# Patient Record
Sex: Female | Born: 1950 | Race: White | Hispanic: No | Marital: Married
Health system: Southern US, Community
[De-identification: ages and names within clinical notes are randomized; demographics above are authoritative.]

---

## 2009-10-27 ENCOUNTER — Encounter: Admission: RE | Admit: 2009-10-27 | Discharge: 2009-10-27 | Payer: Self-pay | Admitting: Family Medicine

## 2011-01-28 ENCOUNTER — Other Ambulatory Visit: Payer: Self-pay | Admitting: Family Medicine

## 2011-01-28 DIAGNOSIS — Z1231 Encounter for screening mammogram for malignant neoplasm of breast: Secondary | ICD-10-CM

## 2011-02-02 ENCOUNTER — Ambulatory Visit
Admission: RE | Admit: 2011-02-02 | Discharge: 2011-02-02 | Disposition: A | Discharge status: Home / Self Care | Payer: Self-pay | Source: Ambulatory Visit | Attending: Family Medicine | Admitting: Family Medicine

## 2011-02-02 DIAGNOSIS — Z1231 Encounter for screening mammogram for malignant neoplasm of breast: Secondary | ICD-10-CM

## 2012-06-23 ENCOUNTER — Other Ambulatory Visit: Payer: Self-pay | Admitting: Family Medicine

## 2012-06-23 DIAGNOSIS — Z1231 Encounter for screening mammogram for malignant neoplasm of breast: Secondary | ICD-10-CM

## 2012-07-04 ENCOUNTER — Ambulatory Visit: Payer: Managed Care, Other (non HMO)

## 2012-07-18 ENCOUNTER — Ambulatory Visit: Payer: Managed Care, Other (non HMO)

## 2013-06-12 ENCOUNTER — Other Ambulatory Visit: Payer: Self-pay | Admitting: Family Medicine

## 2013-06-12 DIAGNOSIS — Z1231 Encounter for screening mammogram for malignant neoplasm of breast: Secondary | ICD-10-CM

## 2013-06-12 DIAGNOSIS — M810 Age-related osteoporosis without current pathological fracture: Secondary | ICD-10-CM

## 2013-06-26 ENCOUNTER — Ambulatory Visit (INDEPENDENT_AMBULATORY_CARE_PROVIDER_SITE_OTHER): Payer: Managed Care, Other (non HMO)

## 2013-06-26 DIAGNOSIS — Z1231 Encounter for screening mammogram for malignant neoplasm of breast: Secondary | ICD-10-CM

## 2013-06-26 DIAGNOSIS — M810 Age-related osteoporosis without current pathological fracture: Secondary | ICD-10-CM

## 2015-05-29 ENCOUNTER — Other Ambulatory Visit: Payer: Self-pay | Admitting: Family Medicine

## 2015-05-29 DIAGNOSIS — Z1231 Encounter for screening mammogram for malignant neoplasm of breast: Secondary | ICD-10-CM

## 2015-08-21 ENCOUNTER — Ambulatory Visit (INDEPENDENT_AMBULATORY_CARE_PROVIDER_SITE_OTHER): Payer: Managed Care, Other (non HMO)

## 2015-08-21 DIAGNOSIS — Z1231 Encounter for screening mammogram for malignant neoplasm of breast: Secondary | ICD-10-CM

## 2015-10-15 ENCOUNTER — Other Ambulatory Visit: Payer: Self-pay | Admitting: Family Medicine

## 2015-10-15 DIAGNOSIS — M810 Age-related osteoporosis without current pathological fracture: Secondary | ICD-10-CM

## 2016-01-21 ENCOUNTER — Other Ambulatory Visit: Payer: Managed Care, Other (non HMO)

## 2016-01-28 ENCOUNTER — Ambulatory Visit (INDEPENDENT_AMBULATORY_CARE_PROVIDER_SITE_OTHER): Payer: Managed Care, Other (non HMO)

## 2016-01-28 DIAGNOSIS — M810 Age-related osteoporosis without current pathological fracture: Secondary | ICD-10-CM

## 2016-01-28 DIAGNOSIS — M85852 Other specified disorders of bone density and structure, left thigh: Secondary | ICD-10-CM

## 2017-03-31 ENCOUNTER — Other Ambulatory Visit: Payer: Self-pay | Admitting: Family Medicine

## 2017-03-31 DIAGNOSIS — Z1231 Encounter for screening mammogram for malignant neoplasm of breast: Secondary | ICD-10-CM

## 2017-04-13 ENCOUNTER — Ambulatory Visit: Payer: Managed Care, Other (non HMO)

## 2017-05-04 ENCOUNTER — Ambulatory Visit: Payer: Managed Care, Other (non HMO)

## 2017-05-06 ENCOUNTER — Ambulatory Visit (INDEPENDENT_AMBULATORY_CARE_PROVIDER_SITE_OTHER): Payer: Medicare Other

## 2017-05-06 DIAGNOSIS — Z1231 Encounter for screening mammogram for malignant neoplasm of breast: Secondary | ICD-10-CM

## 2018-07-13 ENCOUNTER — Other Ambulatory Visit: Payer: Self-pay | Admitting: Family Medicine

## 2018-07-13 DIAGNOSIS — Z1231 Encounter for screening mammogram for malignant neoplasm of breast: Secondary | ICD-10-CM

## 2018-08-22 ENCOUNTER — Ambulatory Visit
Admission: RE | Admit: 2018-08-22 | Discharge: 2018-08-22 | Disposition: A | Discharge status: Home / Self Care | Payer: Medicare Other | Source: Ambulatory Visit | Attending: Family Medicine | Admitting: Family Medicine

## 2018-08-22 DIAGNOSIS — Z1231 Encounter for screening mammogram for malignant neoplasm of breast: Secondary | ICD-10-CM

## 2018-10-17 ENCOUNTER — Other Ambulatory Visit: Payer: Self-pay | Admitting: Family Medicine

## 2018-10-17 DIAGNOSIS — Z780 Asymptomatic menopausal state: Secondary | ICD-10-CM

## 2018-10-17 DIAGNOSIS — M810 Age-related osteoporosis without current pathological fracture: Secondary | ICD-10-CM

## 2019-05-25 ENCOUNTER — Other Ambulatory Visit: Payer: Self-pay | Admitting: Family Medicine

## 2019-05-25 DIAGNOSIS — Z1231 Encounter for screening mammogram for malignant neoplasm of breast: Secondary | ICD-10-CM

## 2019-08-28 ENCOUNTER — Ambulatory Visit
Admission: RE | Admit: 2019-08-28 | Discharge: 2019-08-28 | Disposition: A | Discharge status: Home / Self Care | Payer: Medicare Other | Source: Ambulatory Visit | Attending: Family Medicine | Admitting: Family Medicine

## 2019-08-28 ENCOUNTER — Other Ambulatory Visit: Payer: Self-pay

## 2019-08-28 DIAGNOSIS — Z1231 Encounter for screening mammogram for malignant neoplasm of breast: Secondary | ICD-10-CM

## 2019-08-28 DIAGNOSIS — Z780 Asymptomatic menopausal state: Secondary | ICD-10-CM

## 2019-08-28 DIAGNOSIS — M810 Age-related osteoporosis without current pathological fracture: Secondary | ICD-10-CM

## 2019-10-25 ENCOUNTER — Other Ambulatory Visit: Payer: Medicare Other

## 2020-06-06 LAB — COLOGUARD: COLOGUARD: POSITIVE — AB

## 2020-08-21 ENCOUNTER — Other Ambulatory Visit: Payer: Self-pay | Admitting: Family Medicine

## 2020-08-21 ENCOUNTER — Other Ambulatory Visit: Payer: Self-pay | Admitting: Diagnostic Radiology

## 2020-08-21 DIAGNOSIS — Z1231 Encounter for screening mammogram for malignant neoplasm of breast: Secondary | ICD-10-CM

## 2020-10-01 DIAGNOSIS — M810 Age-related osteoporosis without current pathological fracture: Secondary | ICD-10-CM | POA: Diagnosis not present

## 2020-10-01 DIAGNOSIS — Z0000 Encounter for general adult medical examination without abnormal findings: Secondary | ICD-10-CM | POA: Diagnosis not present

## 2020-10-01 DIAGNOSIS — Z136 Encounter for screening for cardiovascular disorders: Secondary | ICD-10-CM | POA: Diagnosis not present

## 2020-10-08 DIAGNOSIS — R300 Dysuria: Secondary | ICD-10-CM | POA: Diagnosis not present

## 2020-10-09 ENCOUNTER — Other Ambulatory Visit: Payer: Self-pay

## 2020-10-09 ENCOUNTER — Ambulatory Visit
Admission: RE | Admit: 2020-10-09 | Discharge: 2020-10-09 | Disposition: A | Discharge status: Home / Self Care | Payer: Medicare Other | Source: Ambulatory Visit | Attending: Family Medicine | Admitting: Family Medicine

## 2020-10-09 DIAGNOSIS — Z1231 Encounter for screening mammogram for malignant neoplasm of breast: Secondary | ICD-10-CM

## 2020-10-10 ENCOUNTER — Other Ambulatory Visit: Payer: Self-pay | Admitting: Family Medicine

## 2020-10-10 DIAGNOSIS — R928 Other abnormal and inconclusive findings on diagnostic imaging of breast: Secondary | ICD-10-CM

## 2020-10-22 ENCOUNTER — Ambulatory Visit
Admission: RE | Admit: 2020-10-22 | Discharge: 2020-10-22 | Disposition: A | Discharge status: Home / Self Care | Payer: Medicare Other | Source: Ambulatory Visit | Attending: Family Medicine | Admitting: Family Medicine

## 2020-10-22 ENCOUNTER — Other Ambulatory Visit: Payer: Self-pay

## 2020-10-22 ENCOUNTER — Ambulatory Visit: Payer: Medicare Other

## 2020-10-22 DIAGNOSIS — R922 Inconclusive mammogram: Secondary | ICD-10-CM | POA: Diagnosis not present

## 2020-10-22 DIAGNOSIS — R928 Other abnormal and inconclusive findings on diagnostic imaging of breast: Secondary | ICD-10-CM

## 2020-12-16 DIAGNOSIS — L821 Other seborrheic keratosis: Secondary | ICD-10-CM | POA: Diagnosis not present

## 2020-12-16 DIAGNOSIS — L820 Inflamed seborrheic keratosis: Secondary | ICD-10-CM | POA: Diagnosis not present

## 2020-12-16 DIAGNOSIS — L578 Other skin changes due to chronic exposure to nonionizing radiation: Secondary | ICD-10-CM | POA: Diagnosis not present

## 2021-06-11 DIAGNOSIS — M25552 Pain in left hip: Secondary | ICD-10-CM | POA: Diagnosis not present

## 2021-06-15 DIAGNOSIS — Z23 Encounter for immunization: Secondary | ICD-10-CM | POA: Diagnosis not present

## 2021-06-29 DIAGNOSIS — M25552 Pain in left hip: Secondary | ICD-10-CM | POA: Diagnosis not present

## 2021-08-10 ENCOUNTER — Encounter: Payer: Self-pay | Admitting: Podiatry

## 2021-08-10 ENCOUNTER — Ambulatory Visit: Payer: Medicare Other | Admitting: Podiatry

## 2021-08-10 ENCOUNTER — Ambulatory Visit (INDEPENDENT_AMBULATORY_CARE_PROVIDER_SITE_OTHER): Payer: Medicare Other

## 2021-08-10 ENCOUNTER — Other Ambulatory Visit: Payer: Self-pay

## 2021-08-10 DIAGNOSIS — M79671 Pain in right foot: Secondary | ICD-10-CM

## 2021-08-10 DIAGNOSIS — M779 Enthesopathy, unspecified: Secondary | ICD-10-CM

## 2021-08-10 DIAGNOSIS — M7671 Peroneal tendinitis, right leg: Secondary | ICD-10-CM

## 2021-08-10 MED ORDER — TRIAMCINOLONE ACETONIDE 10 MG/ML IJ SUSP
10.0000 mg | Freq: Once | INTRAMUSCULAR | Status: AC
  Administered 2021-08-10: 10 mg

## 2021-08-10 NOTE — Progress Notes (Signed)
Subjective:   Patient ID: Cynthia Strong, female   DOB: 70 y.o.   MRN: [id number]   HPI Patient presents stating she is having a lot of pain in the outside of her right foot and also had hip problems left and has a bad big toe joint of the right foot.  States that its been getting sore on this outside and has been present for a number of weeks worse over the last 2 weeks.  Patient does not smoke likes to be active   Review of Systems  All other systems reviewed and are negative.      Objective:  Physical Exam Vitals and nursing note reviewed.  Constitutional:      Appearance: She is well-developed.  Pulmonary:     Effort: Pulmonary effort is normal.  Musculoskeletal:        General: Normal range of motion.  Skin:    General: Skin is warm.  Neurological:     Mental Status: She is alert.    Neurovascular status intact muscle strength found to be adequate range of motion within normal limits.  Patient is found to have inflammation and pain of the outside of the right foot with fluid buildup at the base of the fifth metatarsal where the tendon connects to the bone.  Patient has severe loss of motion first MPJ right minimal discomfort and has had hip pain left.  Good digital perfusion well oriented x3     Assessment:  Acute peroneal tendinitis right with inflammation with hallux limitus rigidus deformity right     Plan:  H&P reviewed condition and I get a focus on the right foot and I did explain injection chances for rupture and she wants procedure.  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

## 2021-08-10 NOTE — Progress Notes (Signed)
Dg righ

## 2021-08-11 ENCOUNTER — Other Ambulatory Visit: Payer: Self-pay | Admitting: Podiatry

## 2021-08-11 DIAGNOSIS — M779 Enthesopathy, unspecified: Secondary | ICD-10-CM

## 2021-08-20 ENCOUNTER — Other Ambulatory Visit: Payer: Self-pay

## 2021-08-20 ENCOUNTER — Encounter: Payer: Self-pay | Admitting: Podiatry

## 2021-08-20 ENCOUNTER — Ambulatory Visit: Payer: Medicare Other | Admitting: Podiatry

## 2021-08-20 DIAGNOSIS — M7671 Peroneal tendinitis, right leg: Secondary | ICD-10-CM | POA: Diagnosis not present

## 2021-08-20 MED ORDER — MELOXICAM 15 MG PO TABS: 15.0000 mg | ORAL_TABLET | Freq: Every day | ORAL | 0 refills | Status: AC

## 2021-08-20 NOTE — Patient Instructions (Signed)
Peroneal Tendinopathy Rehab ?Ask your health care provider which exercises are safe for you. Do exercises exactly as told by your health care provider and adjust them as directed. It is normal to feel mild stretching, pulling, tightness, or discomfort as you do these exercises. Stop right away if you feel sudden pain or your pain gets worse. Do not begin these exercises until told by your health care provider. ?Stretching and range-of-motion exercises ?These exercises warm up your muscles and joints and improve the movement and flexibility of your ankle. These exercises also help to relieve pain and stiffness. ?Gastroc and soleus stretch, standing ?This is an exercise in which you stand on a step and use your body weight to stretch your calf muscles. To do this exercise: ?Stand on the edge of a step on the ball of your left / right foot. The ball of your foot is on the walking surface, right under your toes. ?Keep your other foot firmly on the same step. ?Hold on to the wall, a railing, or a chair for balance. ?Slowly lift your other foot, allowing your body weight to press your left / right heel down over the edge of the step. You should feel a stretch in your left / right calf (gastrocnemius and soleus). ?Hold this position for __________ seconds. ?Return both feet to the step. ?Repeat this exercise with a slight bend in your left / right knee. ?Repeat __________ times with your left / right knee straight and __________ times with your left / right knee bent. Complete this exercise __________ times a day. ?Strengthening exercises ?These exercises build strength and endurance in your foot and ankle. Endurance is the ability to use your muscles for a long time, even after they get tired. ?Ankle dorsiflexion with band ? ?Secure a rubber exercise band or tube to an object, such as a table leg, that will not move when the band is pulled. ?Secure the other end of the band around your left / right foot. ?Sit on the  floor, facing the object with your left / right leg extended. The band or tube should be slightly tense when your foot is relaxed. ?Slowly flex your left / right ankle and toes to bring your foot toward you (dorsiflexion). ?Hold this position for __________ seconds. ?Let the band or tube slowly pull your foot back to the starting position. ?Repeat __________ times. Complete this exercise __________ times a day. ?Ankle eversion ?Sit on the floor with your legs straight out in front of you. ?Loop a rubber exercise band or tube around the ball of your left / right foot. The ball of your foot is on the walking surface, right under your toes. ?Hold the ends of the band in your hands, or secure the band to a stable object. The band or tube should be slightly tense when your foot is relaxed. ?Slowly push your foot outward, away from your other leg (eversion). ?Hold this position for __________ seconds. ?Slowly return your foot to the starting position. ?Repeat __________ times. Complete this exercise __________ times a day. ?Plantar flexion, standing ?This exercise is sometimes called standing heel raise. ?Stand with your feet shoulder-width apart. ?Place your hands on a wall or table to steady yourself as needed, but try not to use it for support. ?Keep your weight spread evenly over the width of your feet while you slowly rise up on your toes (plantar flexion). If told by your health care provider: ?Shift your weight toward your left / right   leg until you feel challenged. ?Stand on your left / right leg only. ?Hold this position for __________ seconds. ?Repeat __________ times. Complete this exercise __________ times a day. ?Single leg stand ?Without shoes, stand near a railing or in a doorway. You may hold on to the railing or door frame as needed. ?Stand on your left / right foot. Keep your big toe down on the floor and try to keep your arch lifted. ?Do not roll to the outside of your foot. ?If this exercise is too  easy, you can try it with your eyes closed or while standing on a pillow. ?Hold this position for __________ seconds. ?Repeat __________ times. Complete this exercise __________ times a day. ?This information is not intended to replace advice given to you by your health care provider. Make sure you discuss any questions you have with your health care provider. ?Document Revised: 12/26/2018 Document Reviewed: 12/26/2018 ?[employer]vier Patient Education ? 2022 [employer] ? ?

## 2021-08-20 NOTE — Progress Notes (Signed)
  Subjective:  Patient ID: Cynthia Strong, female    DOB: 05/18/1951,   MRN: [id number]  Chief Complaint  Patient presents with   Follow-up    Acute peroneal tendinitis right with inflammation with hallux limitus rigidus deformity right      70 y.o. female presents for concern of right lateral foot pain She saw Dr. Charlsie Merles a few weeks ago and received an injection that did not make much of a difference. Relates she is concerned she does not have enough support . Denies any other pedal complaints. Denies n/v/f/c.   No past medical history on file.  Objective:  Physical Exam: Vascular: DP/PT pulses 2/4 bilateral. CFT <3 seconds. Normal hair growth on digits. No edema.  Skin. No lacerations or abrasions bilateral feet.  Musculoskeletal: MMT 5/5 bilateral lower extremities in DF, PF, Inversion and Eversion. Deceased ROM in DF of ankle joint. Tender to peroneal tendon insertion on fifth metatarsal. Tender with eversion and dorsiflexion Neurological: Sensation intact to light touch.   Assessment:   1. Peroneal tendinitis of right lower extremity      Plan:  Patient was evaluated and treated and all questions answered. X-rays reviewed and discussed with patient. Discussed peroneal tendinitis and treatment options at length with patient Discussed stretching exercises and provided handout. Referral to PT to add on peroneal tendon exercises and modalities.  Prescription for meloxicam provided Dispensed Tri-Lock ankle brace. Discussed that if the symptoms do not improve can consider PT/MRI. Patient to return in 6 to 8 weeks or sooner if symptoms fail to improve or worsen.   Louann Sjogren, DPM

## 2021-08-21 ENCOUNTER — Other Ambulatory Visit: Payer: Self-pay | Admitting: Podiatry

## 2021-08-21 DIAGNOSIS — M7671 Peroneal tendinitis, right leg: Secondary | ICD-10-CM

## 2021-08-25 DIAGNOSIS — M6259 Muscle wasting and atrophy, not elsewhere classified, multiple sites: Secondary | ICD-10-CM | POA: Diagnosis not present

## 2021-08-25 DIAGNOSIS — M25552 Pain in left hip: Secondary | ICD-10-CM | POA: Diagnosis not present

## 2021-08-25 DIAGNOSIS — R269 Unspecified abnormalities of gait and mobility: Secondary | ICD-10-CM | POA: Diagnosis not present

## 2021-08-31 DIAGNOSIS — M6259 Muscle wasting and atrophy, not elsewhere classified, multiple sites: Secondary | ICD-10-CM | POA: Diagnosis not present

## 2021-08-31 DIAGNOSIS — M25552 Pain in left hip: Secondary | ICD-10-CM | POA: Diagnosis not present

## 2021-08-31 DIAGNOSIS — R269 Unspecified abnormalities of gait and mobility: Secondary | ICD-10-CM | POA: Diagnosis not present

## 2021-09-17 ENCOUNTER — Other Ambulatory Visit: Payer: Self-pay

## 2021-09-17 ENCOUNTER — Ambulatory Visit: Payer: Medicare Other | Admitting: Podiatry

## 2021-09-17 ENCOUNTER — Encounter: Payer: Self-pay | Admitting: Podiatry

## 2021-09-17 DIAGNOSIS — M7671 Peroneal tendinitis, right leg: Secondary | ICD-10-CM | POA: Diagnosis not present

## 2021-09-17 MED ORDER — DEXAMETHASONE SODIUM PHOSPHATE 120 MG/30ML IJ SOLN: 4.0000 mg | Freq: Once | INTRAMUSCULAR | Status: AC

## 2021-09-17 NOTE — Progress Notes (Signed)
°  Subjective:  Patient ID: Cynthia Strong, female    DOB: 08-20-1951,   MRN: [id number]  Chief Complaint  Patient presents with   Foot Pain    Right foot pain follow up , patient states pain has not gotten any better since the last visit. Pain has shifted     70 y.o. female presents for follow-up of right peroneal tendonitis. Patient relates she was doing well and then tweaked her foot on a pine cone and has been having more pain in the area on the outside of her foot. Has been doing PT exercises and taking meloxicam which she is not sure helps. Not sure the brace has been helping. Denies any other pedal complaints. Denies n/v/f/c.   History reviewed. No pertinent past medical history.  Objective:  Physical Exam: Vascular: DP/PT pulses 2/4 bilateral. CFT <3 seconds. Normal hair growth on digits. No edema.  Skin. No lacerations or abrasions bilateral feet.  Musculoskeletal: MMT 5/5 bilateral lower extremities in DF, PF, Inversion and Eversion. Deceased ROM in DF of ankle joint. Tender to peroneal tendon insertion on fifth metatarsal and more proximally along the tendon to malleolus. . Tender with eversion and dorsiflexion Neurological: Sensation intact to light touch.   Assessment:   1. Peroneal tendinitis of right lower extremity      Plan:  Patient was evaluated and treated and all questions answered. X-rays reviewed and discussed with patient. Discussed peroneal tendinitis and treatment options at length with patient Discussed stretching exercises and provided handout. Continue PT  Discussed trying topical voltaren Continue with  Tri-Lock ankle brace. Injection today. Procedure below  Discussed that if the symptoms do not improve can consider MRI. Patient to return as needed.    Procedure: Injection Tendon/Ligament Discussed alternatives, risks, complications and verbal consent was obtained.  Location: Right peroneal tendon sheath . Skin Prep: Alcohol. Injectate: 1cc  0.5% marcaine plain, 1 cc dexamethasone.  Disposition: Patient tolerated procedure well. Injection site dressed with a band-aid.  Post-injection care was discussed and return precautions discussed.    Louann Sjogren, DPM

## 2021-09-24 DIAGNOSIS — M25552 Pain in left hip: Secondary | ICD-10-CM | POA: Diagnosis not present

## 2021-09-24 DIAGNOSIS — R269 Unspecified abnormalities of gait and mobility: Secondary | ICD-10-CM | POA: Diagnosis not present

## 2021-09-24 DIAGNOSIS — M6259 Muscle wasting and atrophy, not elsewhere classified, multiple sites: Secondary | ICD-10-CM | POA: Diagnosis not present

## 2021-09-28 ENCOUNTER — Other Ambulatory Visit: Payer: Self-pay | Admitting: Family Medicine

## 2021-09-28 DIAGNOSIS — Z1231 Encounter for screening mammogram for malignant neoplasm of breast: Secondary | ICD-10-CM

## 2021-10-09 DIAGNOSIS — M25552 Pain in left hip: Secondary | ICD-10-CM | POA: Diagnosis not present

## 2021-10-09 DIAGNOSIS — M6259 Muscle wasting and atrophy, not elsewhere classified, multiple sites: Secondary | ICD-10-CM | POA: Diagnosis not present

## 2021-10-09 DIAGNOSIS — R269 Unspecified abnormalities of gait and mobility: Secondary | ICD-10-CM | POA: Diagnosis not present

## 2021-10-12 DIAGNOSIS — Z0000 Encounter for general adult medical examination without abnormal findings: Secondary | ICD-10-CM | POA: Diagnosis not present

## 2021-10-12 DIAGNOSIS — Z23 Encounter for immunization: Secondary | ICD-10-CM | POA: Diagnosis not present

## 2021-10-12 DIAGNOSIS — Z1231 Encounter for screening mammogram for malignant neoplasm of breast: Secondary | ICD-10-CM | POA: Diagnosis not present

## 2021-10-14 ENCOUNTER — Ambulatory Visit
Admission: RE | Admit: 2021-10-14 | Discharge: 2021-10-14 | Disposition: A | Discharge status: Home / Self Care | Payer: Medicare Other | Source: Ambulatory Visit | Attending: Family Medicine | Admitting: Family Medicine

## 2021-10-14 DIAGNOSIS — Z1231 Encounter for screening mammogram for malignant neoplasm of breast: Secondary | ICD-10-CM | POA: Diagnosis not present

## 2022-05-17 DIAGNOSIS — Z0100 Encounter for examination of eyes and vision without abnormal findings: Secondary | ICD-10-CM | POA: Diagnosis not present

## 2022-05-19 DIAGNOSIS — M7062 Trochanteric bursitis, left hip: Secondary | ICD-10-CM | POA: Diagnosis not present

## 2022-05-28 ENCOUNTER — Other Ambulatory Visit: Payer: Self-pay | Admitting: Family Medicine

## 2022-05-28 DIAGNOSIS — M25552 Pain in left hip: Secondary | ICD-10-CM

## 2022-06-01 ENCOUNTER — Ambulatory Visit (INDEPENDENT_AMBULATORY_CARE_PROVIDER_SITE_OTHER): Payer: Medicare Other

## 2022-06-01 DIAGNOSIS — G8929 Other chronic pain: Secondary | ICD-10-CM | POA: Diagnosis not present

## 2022-06-01 DIAGNOSIS — M25552 Pain in left hip: Secondary | ICD-10-CM | POA: Diagnosis not present

## 2022-06-09 DIAGNOSIS — Z23 Encounter for immunization: Secondary | ICD-10-CM | POA: Diagnosis not present

## 2022-06-23 DIAGNOSIS — R6889 Other general symptoms and signs: Secondary | ICD-10-CM | POA: Diagnosis not present

## 2022-10-07 ENCOUNTER — Other Ambulatory Visit: Payer: Self-pay | Admitting: Family Medicine

## 2022-10-07 DIAGNOSIS — Z1231 Encounter for screening mammogram for malignant neoplasm of breast: Secondary | ICD-10-CM

## 2022-10-19 DIAGNOSIS — M810 Age-related osteoporosis without current pathological fracture: Secondary | ICD-10-CM | POA: Diagnosis not present

## 2022-10-19 DIAGNOSIS — Z0000 Encounter for general adult medical examination without abnormal findings: Secondary | ICD-10-CM | POA: Diagnosis not present

## 2022-10-19 DIAGNOSIS — Z8669 Personal history of other diseases of the nervous system and sense organs: Secondary | ICD-10-CM | POA: Diagnosis not present

## 2022-10-19 DIAGNOSIS — K219 Gastro-esophageal reflux disease without esophagitis: Secondary | ICD-10-CM | POA: Diagnosis not present

## 2022-10-19 DIAGNOSIS — Z7185 Encounter for immunization safety counseling: Secondary | ICD-10-CM | POA: Diagnosis not present

## 2022-10-19 DIAGNOSIS — Z13220 Encounter for screening for lipoid disorders: Secondary | ICD-10-CM | POA: Diagnosis not present

## 2022-10-19 DIAGNOSIS — R030 Elevated blood-pressure reading, without diagnosis of hypertension: Secondary | ICD-10-CM | POA: Diagnosis not present

## 2022-10-19 DIAGNOSIS — L819 Disorder of pigmentation, unspecified: Secondary | ICD-10-CM | POA: Diagnosis not present

## 2022-10-19 DIAGNOSIS — M67449 Ganglion, unspecified hand: Secondary | ICD-10-CM | POA: Diagnosis not present

## 2022-10-19 DIAGNOSIS — Z136 Encounter for screening for cardiovascular disorders: Secondary | ICD-10-CM | POA: Diagnosis not present

## 2022-10-20 ENCOUNTER — Other Ambulatory Visit: Payer: Self-pay | Admitting: Family Medicine

## 2022-10-20 DIAGNOSIS — M810 Age-related osteoporosis without current pathological fracture: Secondary | ICD-10-CM

## 2022-11-02 DIAGNOSIS — M67441 Ganglion, right hand: Secondary | ICD-10-CM | POA: Diagnosis not present

## 2022-11-02 DIAGNOSIS — M1811 Unilateral primary osteoarthritis of first carpometacarpal joint, right hand: Secondary | ICD-10-CM | POA: Diagnosis not present

## 2022-11-02 DIAGNOSIS — M79644 Pain in right finger(s): Secondary | ICD-10-CM | POA: Diagnosis not present

## 2022-11-26 ENCOUNTER — Ambulatory Visit
Admission: RE | Admit: 2022-11-26 | Discharge: 2022-11-26 | Disposition: A | Discharge status: Home / Self Care | Payer: Medicare Other | Source: Ambulatory Visit | Attending: Family Medicine | Admitting: Family Medicine

## 2022-11-26 DIAGNOSIS — Z1231 Encounter for screening mammogram for malignant neoplasm of breast: Secondary | ICD-10-CM | POA: Diagnosis not present

## 2022-11-29 DIAGNOSIS — I10 Essential (primary) hypertension: Secondary | ICD-10-CM | POA: Diagnosis not present

## 2022-12-22 DIAGNOSIS — C4372 Malignant melanoma of left lower limb, including hip: Secondary | ICD-10-CM | POA: Diagnosis not present

## 2022-12-29 DIAGNOSIS — C4372 Malignant melanoma of left lower limb, including hip: Secondary | ICD-10-CM | POA: Diagnosis not present

## 2023-01-03 DIAGNOSIS — C4372 Malignant melanoma of left lower limb, including hip: Secondary | ICD-10-CM | POA: Diagnosis not present

## 2023-01-03 IMAGING — MG DIGITAL SCREENING BILAT W/ TOMO W/ CAD
6 of 10 series · 6 of 30 positions shown · non-contrast
Comparison: Previous exam(s).

CLINICAL DATA: Screening.

EXAM:
DIGITAL SCREENING BILATERAL MAMMOGRAM WITH TOMO AND CAD

[L CC synth-2D]
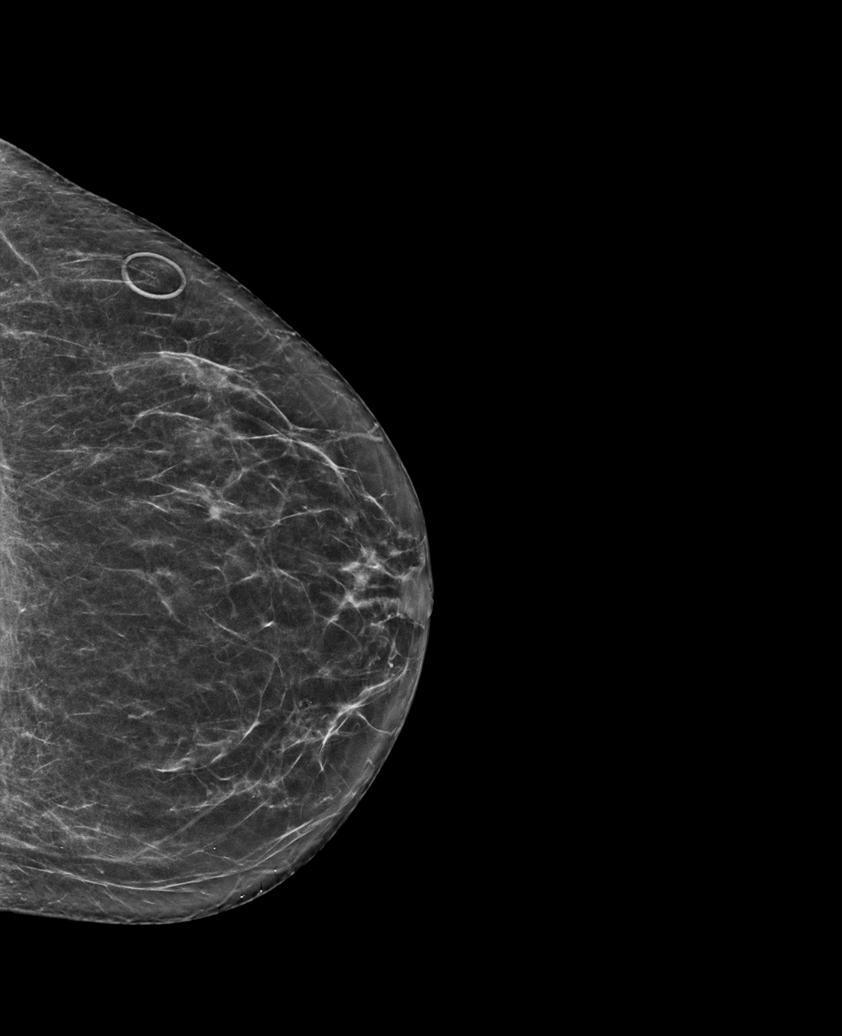

[R CC synth-2D]
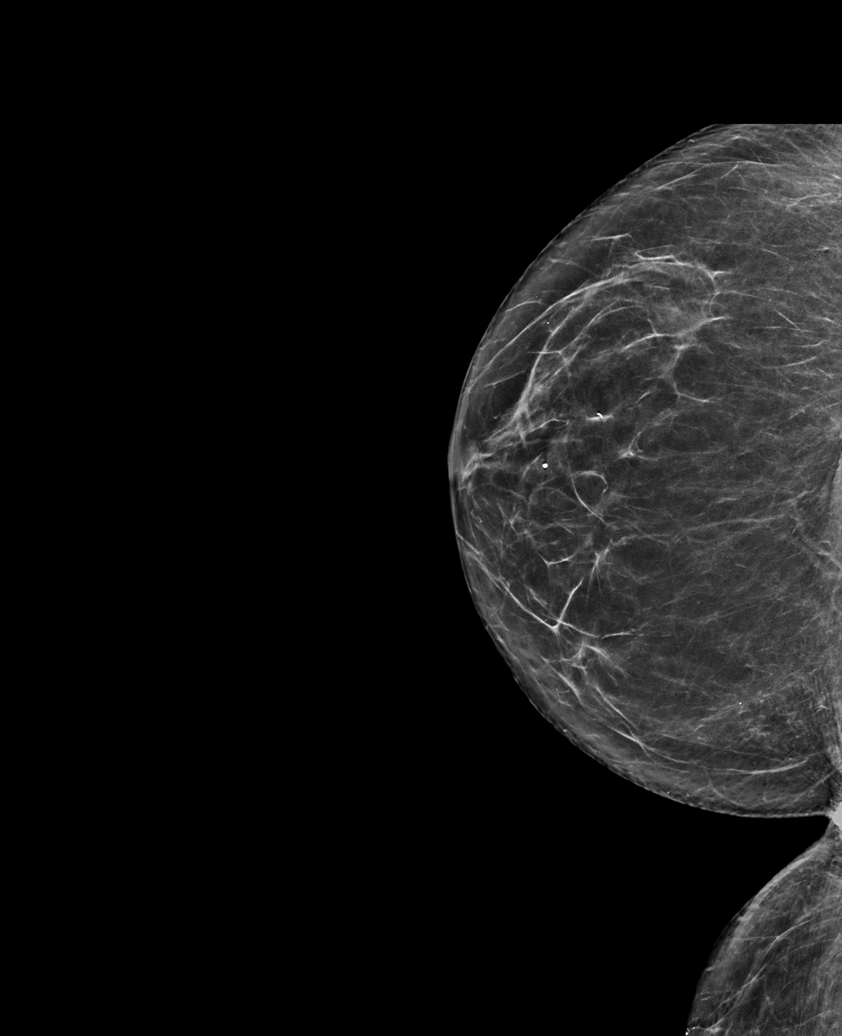

[L MLO synth-2D (1 of 2)]
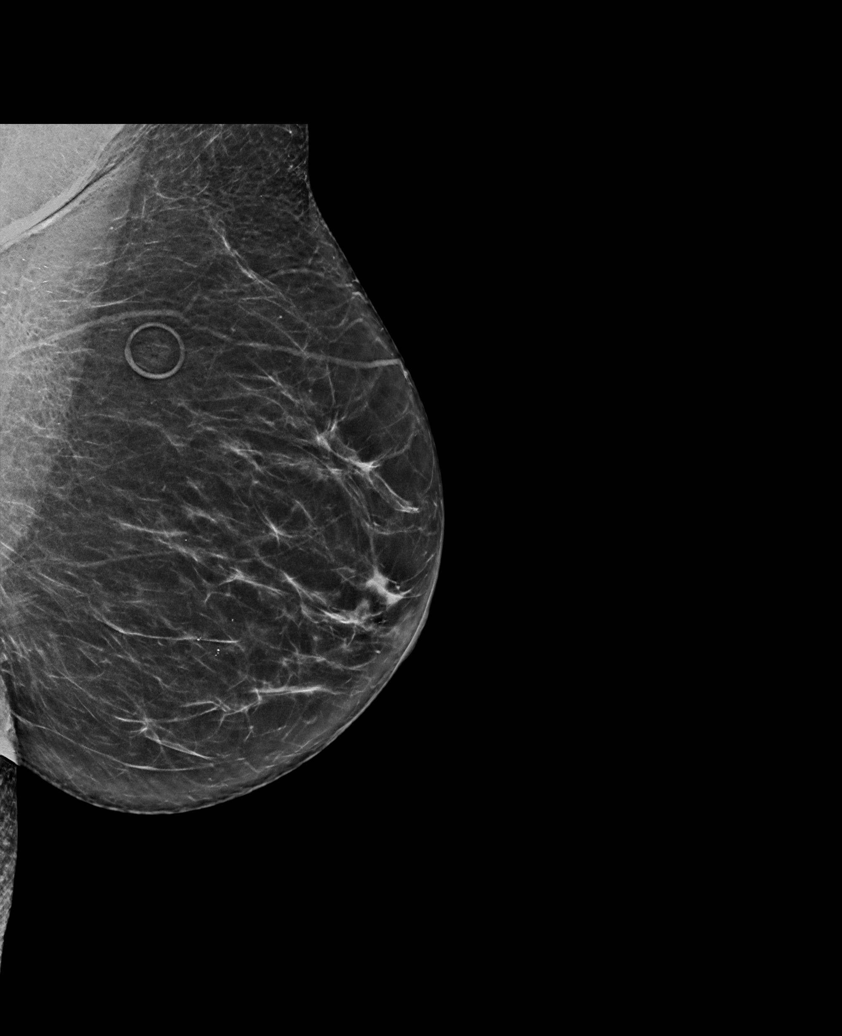

[R MLO synth-2D]
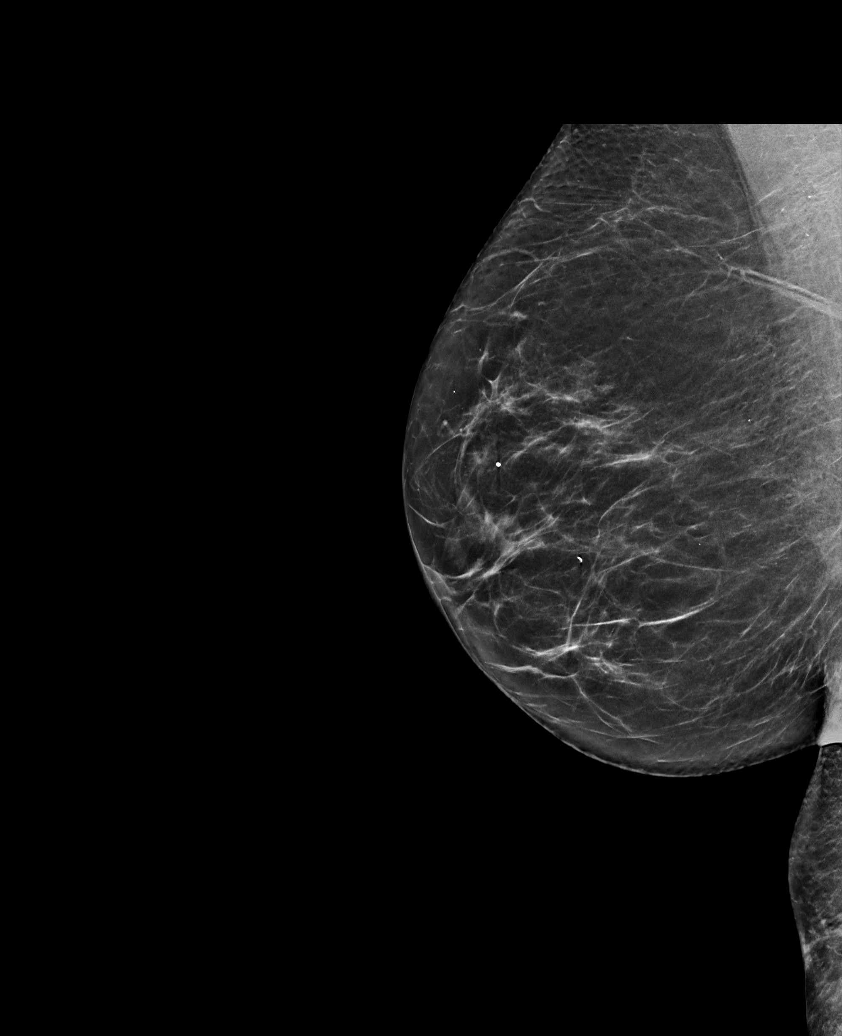

[L MLO synth-2D (2 of 2)]
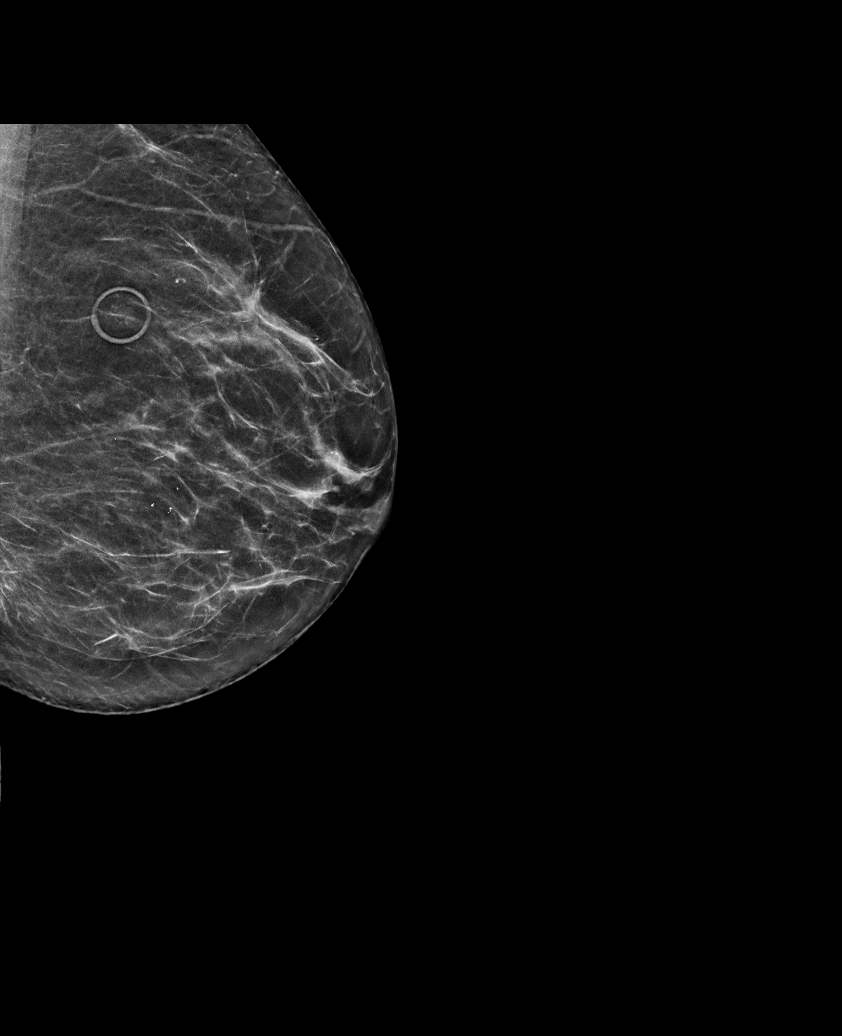

[L CC tomo · tomo slice 31/62.0]
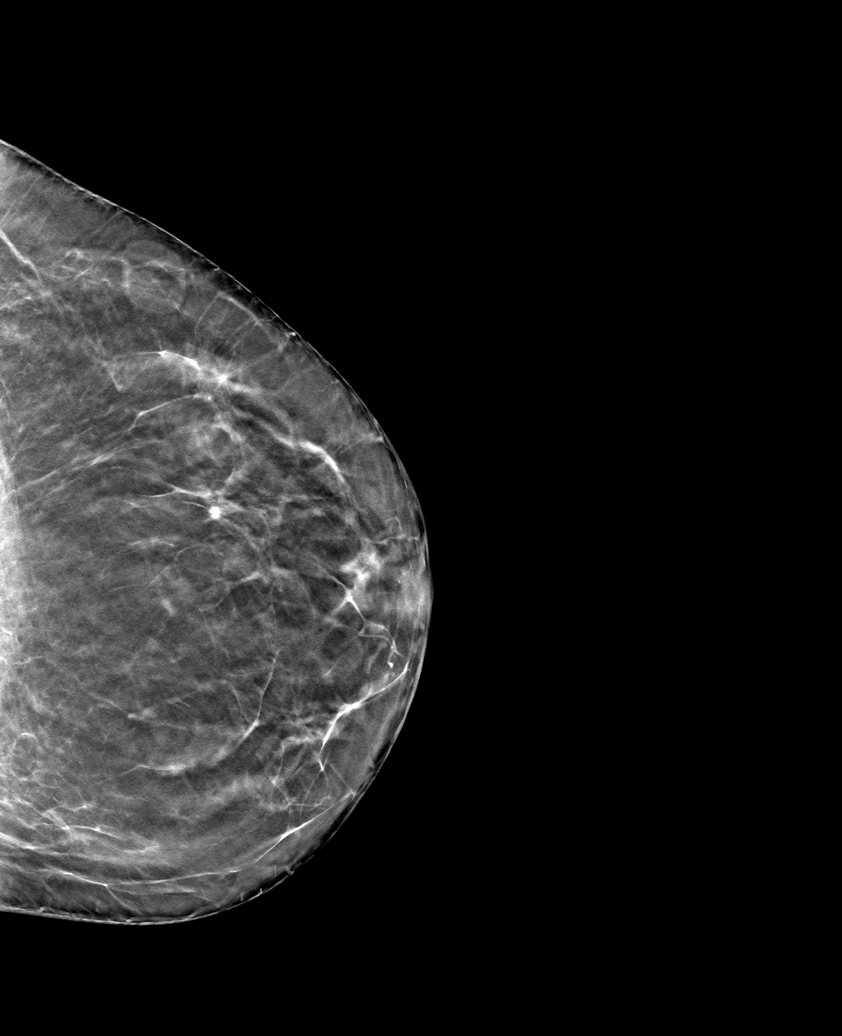

[6 of 30 positions shown; findings below may reference images not displayed]

ACR Breast Density Category b: There are scattered areas of
fibroglandular density.
FINDINGS: In the left breast, a possible asymmetry warrants further
evaluation. In the right breast, no findings suspicious for
malignancy. Images were processed with CAD.
IMPRESSION: Further evaluation is suggested for possible asymmetry in the left
breast.

RECOMMENDATION:
Diagnostic mammogram and possibly ultrasound of the left breast.
(Code:DI-5-LL4)

The patient will be contacted regarding the findings, and additional
imaging will be scheduled.

BI-RADS CATEGORY  0: Incomplete. Need additional imaging evaluation
and/or prior mammograms for comparison.

## 2023-01-18 DIAGNOSIS — C4372 Malignant melanoma of left lower limb, including hip: Secondary | ICD-10-CM | POA: Diagnosis not present

## 2023-01-18 DIAGNOSIS — I10 Essential (primary) hypertension: Secondary | ICD-10-CM | POA: Diagnosis not present

## 2023-02-01 DIAGNOSIS — C4372 Malignant melanoma of left lower limb, including hip: Secondary | ICD-10-CM | POA: Diagnosis not present

## 2023-02-01 DIAGNOSIS — L989 Disorder of the skin and subcutaneous tissue, unspecified: Secondary | ICD-10-CM | POA: Diagnosis not present

## 2023-02-01 DIAGNOSIS — L905 Scar conditions and fibrosis of skin: Secondary | ICD-10-CM | POA: Diagnosis not present

## 2023-03-22 DIAGNOSIS — R6889 Other general symptoms and signs: Secondary | ICD-10-CM | POA: Diagnosis not present

## 2023-04-07 DIAGNOSIS — D485 Neoplasm of uncertain behavior of skin: Secondary | ICD-10-CM | POA: Diagnosis not present

## 2023-04-07 DIAGNOSIS — Z85820 Personal history of malignant melanoma of skin: Secondary | ICD-10-CM | POA: Diagnosis not present

## 2023-04-07 DIAGNOSIS — R229 Localized swelling, mass and lump, unspecified: Secondary | ICD-10-CM | POA: Diagnosis not present

## 2023-04-07 DIAGNOSIS — D225 Melanocytic nevi of trunk: Secondary | ICD-10-CM | POA: Diagnosis not present

## 2023-04-07 DIAGNOSIS — C4372 Malignant melanoma of left lower limb, including hip: Secondary | ICD-10-CM | POA: Diagnosis not present

## 2023-04-07 DIAGNOSIS — D229 Melanocytic nevi, unspecified: Secondary | ICD-10-CM | POA: Diagnosis not present

## 2023-04-07 DIAGNOSIS — L821 Other seborrheic keratosis: Secondary | ICD-10-CM | POA: Diagnosis not present

## 2023-04-07 DIAGNOSIS — Z08 Encounter for follow-up examination after completed treatment for malignant neoplasm: Secondary | ICD-10-CM | POA: Diagnosis not present

## 2023-04-07 DIAGNOSIS — L814 Other melanin hyperpigmentation: Secondary | ICD-10-CM | POA: Diagnosis not present

## 2023-04-14 ENCOUNTER — Ambulatory Visit
Admission: RE | Admit: 2023-04-14 | Discharge: 2023-04-14 | Disposition: A | Discharge status: Home / Self Care | Payer: Medicare Other | Source: Ambulatory Visit | Attending: Family Medicine | Admitting: Family Medicine

## 2023-04-14 DIAGNOSIS — M8588 Other specified disorders of bone density and structure, other site: Secondary | ICD-10-CM | POA: Diagnosis not present

## 2023-04-14 DIAGNOSIS — M810 Age-related osteoporosis without current pathological fracture: Secondary | ICD-10-CM

## 2023-04-14 DIAGNOSIS — E349 Endocrine disorder, unspecified: Secondary | ICD-10-CM | POA: Diagnosis not present

## 2023-05-03 DIAGNOSIS — I10 Essential (primary) hypertension: Secondary | ICD-10-CM | POA: Diagnosis not present

## 2023-06-16 DIAGNOSIS — Z23 Encounter for immunization: Secondary | ICD-10-CM | POA: Diagnosis not present

## 2023-06-28 DIAGNOSIS — L720 Epidermal cyst: Secondary | ICD-10-CM | POA: Diagnosis not present

## 2023-06-28 DIAGNOSIS — D1801 Hemangioma of skin and subcutaneous tissue: Secondary | ICD-10-CM | POA: Diagnosis not present

## 2023-06-28 DIAGNOSIS — L820 Inflamed seborrheic keratosis: Secondary | ICD-10-CM | POA: Diagnosis not present

## 2023-06-28 DIAGNOSIS — D692 Other nonthrombocytopenic purpura: Secondary | ICD-10-CM | POA: Diagnosis not present

## 2023-07-29 DIAGNOSIS — D225 Melanocytic nevi of trunk: Secondary | ICD-10-CM | POA: Diagnosis not present

## 2023-07-29 DIAGNOSIS — L905 Scar conditions and fibrosis of skin: Secondary | ICD-10-CM | POA: Diagnosis not present

## 2023-09-23 ENCOUNTER — Other Ambulatory Visit: Payer: Self-pay | Admitting: Family Medicine

## 2023-09-23 DIAGNOSIS — Z1231 Encounter for screening mammogram for malignant neoplasm of breast: Secondary | ICD-10-CM

## 2023-10-05 DIAGNOSIS — R6889 Other general symptoms and signs: Secondary | ICD-10-CM | POA: Diagnosis not present

## 2023-10-21 DIAGNOSIS — Z136 Encounter for screening for cardiovascular disorders: Secondary | ICD-10-CM | POA: Diagnosis not present

## 2023-10-21 DIAGNOSIS — Z85820 Personal history of malignant melanoma of skin: Secondary | ICD-10-CM | POA: Diagnosis not present

## 2023-10-21 DIAGNOSIS — Z0000 Encounter for general adult medical examination without abnormal findings: Secondary | ICD-10-CM | POA: Diagnosis not present

## 2023-10-21 DIAGNOSIS — M7062 Trochanteric bursitis, left hip: Secondary | ICD-10-CM | POA: Diagnosis not present

## 2023-10-21 DIAGNOSIS — D126 Benign neoplasm of colon, unspecified: Secondary | ICD-10-CM | POA: Diagnosis not present

## 2023-10-21 DIAGNOSIS — K219 Gastro-esophageal reflux disease without esophagitis: Secondary | ICD-10-CM | POA: Diagnosis not present

## 2023-10-21 DIAGNOSIS — M810 Age-related osteoporosis without current pathological fracture: Secondary | ICD-10-CM | POA: Diagnosis not present

## 2023-10-21 DIAGNOSIS — Z13220 Encounter for screening for lipoid disorders: Secondary | ICD-10-CM | POA: Diagnosis not present

## 2023-10-21 DIAGNOSIS — I10 Essential (primary) hypertension: Secondary | ICD-10-CM | POA: Diagnosis not present

## 2023-11-28 ENCOUNTER — Ambulatory Visit
Admission: RE | Admit: 2023-11-28 | Discharge: 2023-11-28 | Disposition: A | Discharge status: Home / Self Care | Payer: Medicare Other | Source: Ambulatory Visit | Attending: Family Medicine | Admitting: Family Medicine

## 2023-11-28 DIAGNOSIS — Z1231 Encounter for screening mammogram for malignant neoplasm of breast: Secondary | ICD-10-CM

## 2023-12-06 DIAGNOSIS — L821 Other seborrheic keratosis: Secondary | ICD-10-CM | POA: Diagnosis not present

## 2023-12-06 DIAGNOSIS — D1801 Hemangioma of skin and subcutaneous tissue: Secondary | ICD-10-CM | POA: Diagnosis not present

## 2023-12-06 DIAGNOSIS — C4372 Malignant melanoma of left lower limb, including hip: Secondary | ICD-10-CM | POA: Diagnosis not present

## 2023-12-06 DIAGNOSIS — L814 Other melanin hyperpigmentation: Secondary | ICD-10-CM | POA: Diagnosis not present

## 2023-12-06 DIAGNOSIS — R229 Localized swelling, mass and lump, unspecified: Secondary | ICD-10-CM | POA: Diagnosis not present

## 2023-12-06 DIAGNOSIS — Z08 Encounter for follow-up examination after completed treatment for malignant neoplasm: Secondary | ICD-10-CM | POA: Diagnosis not present

## 2023-12-06 DIAGNOSIS — Z85820 Personal history of malignant melanoma of skin: Secondary | ICD-10-CM | POA: Diagnosis not present

## 2023-12-06 DIAGNOSIS — Z872 Personal history of diseases of the skin and subcutaneous tissue: Secondary | ICD-10-CM | POA: Diagnosis not present

## 2024-02-23 DIAGNOSIS — H905 Unspecified sensorineural hearing loss: Secondary | ICD-10-CM | POA: Diagnosis not present

## 2024-02-24 DIAGNOSIS — I872 Venous insufficiency (chronic) (peripheral): Secondary | ICD-10-CM | POA: Diagnosis not present

## 2024-03-05 DIAGNOSIS — W57XXXA Bitten or stung by nonvenomous insect and other nonvenomous arthropods, initial encounter: Secondary | ICD-10-CM | POA: Diagnosis not present

## 2024-03-05 DIAGNOSIS — S30861A Insect bite (nonvenomous) of abdominal wall, initial encounter: Secondary | ICD-10-CM | POA: Diagnosis not present

## 2024-06-05 DIAGNOSIS — R6889 Other general symptoms and signs: Secondary | ICD-10-CM | POA: Diagnosis not present

## 2024-06-26 DIAGNOSIS — Z23 Encounter for immunization: Secondary | ICD-10-CM | POA: Diagnosis not present

## 2024-09-26 ENCOUNTER — Other Ambulatory Visit: Payer: Self-pay | Admitting: Family Medicine

## 2024-09-26 DIAGNOSIS — Z1231 Encounter for screening mammogram for malignant neoplasm of breast: Secondary | ICD-10-CM

## 2024-11-28 ENCOUNTER — Ambulatory Visit
